# Patient Record
Sex: Female | Born: 1985 | Race: Black or African American | Hispanic: No | Marital: Single | Smoking: Never smoker
Health system: Southern US, Community
[De-identification: ages and names within clinical notes are randomized; demographics above are authoritative.]

## PROBLEM LIST (undated history)

## (undated) DIAGNOSIS — J45909 Unspecified asthma, uncomplicated: Secondary | ICD-10-CM

## (undated) DIAGNOSIS — O139 Gestational [pregnancy-induced] hypertension without significant proteinuria, unspecified trimester: Secondary | ICD-10-CM

## (undated) DIAGNOSIS — G43909 Migraine, unspecified, not intractable, without status migrainosus: Secondary | ICD-10-CM

## (undated) HISTORY — PX: ADENOIDECTOMY: SUR15

## (undated) HISTORY — PX: TONSILLECTOMY: SUR1361

---

## 2010-06-24 ENCOUNTER — Emergency Department: Payer: Self-pay | Admitting: Unknown Physician Specialty

## 2011-02-13 ENCOUNTER — Observation Stay: Payer: Self-pay | Admitting: Obstetrics and Gynecology

## 2011-03-21 ENCOUNTER — Encounter: Payer: Self-pay | Admitting: Maternal & Fetal Medicine

## 2011-03-22 ENCOUNTER — Encounter: Payer: Self-pay | Admitting: Maternal & Fetal Medicine

## 2011-04-11 ENCOUNTER — Observation Stay: Payer: Self-pay | Admitting: Obstetrics and Gynecology

## 2011-04-27 ENCOUNTER — Observation Stay: Payer: Self-pay | Admitting: Obstetrics and Gynecology

## 2011-05-03 ENCOUNTER — Inpatient Hospital Stay: Payer: Self-pay | Admitting: Obstetrics and Gynecology

## 2011-05-03 LAB — CBC WITH DIFFERENTIAL/PLATELET
Basophil %: 0.4 %
Eosinophil #: 0 10*3/uL (ref 0.0–0.7)
Eosinophil %: 0.2 %
HGB: 10.3 g/dL — ABNORMAL LOW (ref 12.0–16.0)
Lymphocyte #: 1.8 10*3/uL (ref 1.0–3.6)
Lymphocyte %: 32.4 %
MCH: 26.7 pg (ref 26.0–34.0)
MCHC: 33 g/dL (ref 32.0–36.0)
Monocyte #: 0.5 10*3/uL (ref 0.0–0.7)
Monocyte %: 9.2 %
Neutrophil %: 57.8 %
Platelet: 309 10*3/uL (ref 150–440)
RBC: 3.84 10*6/uL (ref 3.80–5.20)
RDW: 15.2 % — ABNORMAL HIGH (ref 11.5–14.5)
WBC: 5.6 10*3/uL (ref 3.6–11.0)

## 2011-05-05 LAB — HEMATOCRIT: HCT: 29 % — ABNORMAL LOW (ref 35.0–47.0)

## 2011-07-13 ENCOUNTER — Emergency Department: Payer: Self-pay | Admitting: Emergency Medicine

## 2011-08-17 ENCOUNTER — Emergency Department: Payer: Self-pay | Admitting: Emergency Medicine

## 2011-08-18 LAB — URINALYSIS, COMPLETE
Bacteria: NONE SEEN
Ph: 5 (ref 4.5–8.0)
Protein: 30
RBC,UR: 32 /HPF (ref 0–5)

## 2011-11-23 ENCOUNTER — Emergency Department: Payer: Self-pay | Admitting: Emergency Medicine

## 2011-11-23 LAB — URINALYSIS, COMPLETE
Bacteria: NONE SEEN
Bilirubin,UR: NEGATIVE
Glucose,UR: NEGATIVE mg/dL (ref 0–75)
Ph: 5 (ref 4.5–8.0)
Protein: 30
RBC,UR: 1 /HPF (ref 0–5)
Specific Gravity: 1.033 (ref 1.003–1.030)
Squamous Epithelial: 13
WBC UR: 5 /HPF (ref 0–5)

## 2011-11-23 LAB — WET PREP, GENITAL

## 2011-11-24 ENCOUNTER — Ambulatory Visit: Payer: Self-pay | Admitting: Obstetrics and Gynecology

## 2011-11-24 LAB — HEMOGLOBIN: HGB: 11.9 g/dL — ABNORMAL LOW (ref 12.0–16.0)

## 2011-11-28 LAB — PATHOLOGY REPORT

## 2014-07-08 NOTE — Op Note (Signed)
PATIENT NAME:  Stephanie RecordsGREENE, [name] MR#:  [id number] DATE OF BIRTH:  [birth year]  DATE OF PROCEDURE:  11/24/2011  PREOPERATIVE DIAGNOSES:  1. Incomplete abortion.  2. A+ blood type.   POSTOPERATIVE DIAGNOSES:  1. Inevitable abortion.  2. A+ blood type.   OPERATIVE PROCEDURE: Suction D and C.   SURGEON: Herold HarmsMartin A DeFrancesco, M.D.     FIRST ASSISTANT:  None.  ANESTHESIA: General LMA.   INDICATIONS: The patient is a 29 year old African American female, gravida 3, para 2-0-0-2, at 1110 weeks' gestation who presents for surgical management of nonviable pregnancy. The patient had two successive ultrasounds 24 hours apart which revealed an intrauterine pregnancy six weeks in size by crown-rump length without fetal cardiac activity. Quantitative hCG titers were rising inappropriately. The patient also had bleeding and cramping.   FINDINGS AT SURGERY: The cervical os was opened. There was POC extruding from the cervical os.   DESCRIPTION OF PROCEDURE: The patient was brought to the operating room where she was placed in the supine position. General anesthesia with LMA anesthesia was induced without difficulty. She was placed in the dorsal lithotomy position using candy-cane stirrups. A chlorhexadol perineal, intravaginal prep and drape was performed in the standard fashion. Red Robinson catheter was used to drain 25 mL of urine from the bladder. A weighted speculum was placed into the vagina and a single-tooth tenaculum was placed onto the anterior lip of the cervix. POC was identified coming from the cervical os and this was removed with ring forceps. Several passes were made with ring forceps with removal of pregnancy tissue. A #10 suction curette was then used with several passes to remove residual tissue from the endometrial cavity. A serrated curette was used to verify that no significant tissue was left behind. Upon completion of the procedure, all instrumentation was removed from the vagina. The  patient was then awakened, mobilized, and taken to the recovery room in satisfactory condition. All instrument, needle, and sponge counts were verified as correct. Estimated blood loss was minimal. Complications were none.     ____________________________ Prentice DockerMartin A. DeFrancesco, MD mad:bjt D: 11/24/2011 14:16:48 ET T: 11/24/2011 17:26:50 ET JOB#: [id number]  cc: Daphine DeutscherMartin A. DeFrancesco, MD, <Dictator> Prentice DockerMARTIN A DEFRANCESCO MD ELECTRONICALLY SIGNED 11/28/2011 16:40

## 2014-07-29 NOTE — H&P (Signed)
L&D Evaluation:  History:   HPI 29 yo AAF G2P1001 at 39.3 weeks    Presents with IOL    Patient's Medical History + Chlamydia rx'd 04/27/11    Patient's Surgical History none    Medications Pre [place]atal Vitamins    Allergies Iodine    Social History none    Family History Non-Contributory  FOB - Sickle Trait   ROS:   ROS All systems were reviewed.  HEENT, CNS, GI, GU, Respiratory, CV, Renal and Musculoskeletal systems were found to be normal.   Exam:   Vital Signs stable    Urine Protein negative dipstick    General no apparent distress    Mental Status clear    Heart normal sinus rhythm    Abdomen gravid, non-tender    Estimated Fetal Weight Average for gestational age, 7 #0    Back no CVAT    Edema no edema    Pelvic no external lesions, 3.5/70/-3/soft/mid/BOWI/VTX    FHT normal rate with no decels    Ucx irregular    Skin dry    Lymph no lymphadenopathy   Impression:   Impression 39.3 Intrauterine pregnancy; Recent Rx for Chlamydia 04/27/11   Plan:   Plan Cytotec IOL   Electronic Signatures: Josiane Labine, Prentice DockerMartin A (MD)  (Signed 13-Feb-13 04:04)  Authored: L&D Evaluation   Last Updated: 13-Feb-13 04:04 by Shashank Kwasnik, Prentice DockerMartin A (MD)

## 2014-07-29 NOTE — H&P (Signed)
L&D Evaluation:  History:   HPI 36 week Intrauterine pregnancy    Presents with contractions, + Chlamydia    Medications Pre [place]atal Vitamins   Exam:   Vital Signs stable    Estimated Fetal Weight Average for gestational age    Mebranes Intact    FHT NSt Reactive    Ucx irregular    Skin dry    Other +fFn   Impression:   Impression reactive NST, 36 week Intrauterine pregnancy; + Chlamydia   Plan:   Plan Zithromax 1 gram po    Comments Partner needs rx.    Follow Up Appointment already scheduled   Electronic Signatures: Stephanie Scott, Prentice DockerMartin A (MD)  (Signed 22-Jan-13 09:18)  Authored: L&D Evaluation   Last Updated: 22-Jan-13 09:18 by Anhar Mcdermott, Prentice DockerMartin A (MD)

## 2014-07-29 NOTE — H&P (Signed)
L&D Evaluation:  History:   HPI 38 week Intrauterine pregnancy    Presents with contractions    Patient's Medical History +Chlamydia; Dysplasia; History of ASB    Medications Pre Natal Vitamins    Allergies Iodine    Social History none    Family History Non-Contributory   ROS:   ROS All systems were reviewed.  HEENT, CNS, GI, GU, Respiratory, CV, Renal and Musculoskeletal systems were found to be normal.   Exam:   Vital Signs stable    General Uncomfortable    Mental Status clear    Heart normal sinus rhythm    Abdomen gravid, tender with contractions    Estimated Fetal Weight Average for gestational age    Back no CVAT    Pelvic no external lesions    Description 4/80/-3/vrtx/BOWI    FHT normal rate with no decels, Reactive NST    Skin dry    Lymph no lymphadenopathy   Impression:   Impression reactive NST, 38 week Intrauterine pregnancy; +Chlamydia, TOC + after therapyt   Plan:   Plan discharge    Comments Pt given morphine last pm with decrease in contractions and pain. IV Zithromax given for Chlamydia.    Follow Up Appointment 1 day   Electronic Signatures: Ford Peddie, Prentice DockerMartin A (MD)  (Signed 06-Feb-13 08:48)  Authored: L&D Evaluation   Last Updated: 06-Feb-13 08:48 by Daryn Pisani, Prentice DockerMartin A (MD)

## 2016-09-30 ENCOUNTER — Emergency Department (HOSPITAL_BASED_OUTPATIENT_CLINIC_OR_DEPARTMENT_OTHER)
Admission: EM | Admit: 2016-09-30 | Discharge: 2016-09-30 | Disposition: A | Discharge status: Home / Self Care | Payer: Medicaid Other | Attending: Physician Assistant | Admitting: Physician Assistant

## 2016-09-30 ENCOUNTER — Encounter (HOSPITAL_BASED_OUTPATIENT_CLINIC_OR_DEPARTMENT_OTHER): Payer: Self-pay | Admitting: *Deleted

## 2016-09-30 ENCOUNTER — Emergency Department (HOSPITAL_BASED_OUTPATIENT_CLINIC_OR_DEPARTMENT_OTHER): Payer: Medicaid Other

## 2016-09-30 DIAGNOSIS — Y939 Activity, unspecified: Secondary | ICD-10-CM | POA: Insufficient documentation

## 2016-09-30 DIAGNOSIS — Y999 Unspecified external cause status: Secondary | ICD-10-CM | POA: Insufficient documentation

## 2016-09-30 DIAGNOSIS — S6991XA Unspecified injury of right wrist, hand and finger(s), initial encounter: Secondary | ICD-10-CM | POA: Diagnosis present

## 2016-09-30 DIAGNOSIS — S60221A Contusion of right hand, initial encounter: Secondary | ICD-10-CM | POA: Diagnosis not present

## 2016-09-30 DIAGNOSIS — Y929 Unspecified place or not applicable: Secondary | ICD-10-CM | POA: Diagnosis not present

## 2016-09-30 DIAGNOSIS — W228XXA Striking against or struck by other objects, initial encounter: Secondary | ICD-10-CM | POA: Diagnosis not present

## 2016-09-30 MED ORDER — MELOXICAM 15 MG PO TABS: 15.0000 mg | ORAL_TABLET | Freq: Every day | ORAL | 0 refills | Status: DC

## 2016-09-30 NOTE — ED Triage Notes (Signed)
A book case fell on her right hand yesterday at work. Swelling.

## 2016-09-30 NOTE — Discharge Instructions (Signed)
Contact a health care provider if: °Your symptoms do not improve after several days of treatment. °You have increased redness, swelling, or pain in your hand or fingers. °You have difficulty moving the injured area. °Your swelling or pain is not relieved with medicines. °Get help right away if: °You have severe pain. °Your hand or fingers become numb. °Your hand or fingers turn pale, blue, or cold. °You cannot move your hand or wrist. °Your hand is warm to the touch. °

## 2016-09-30 NOTE — ED Provider Notes (Signed)
[facility] Provider [facility]   CSN: [id number] Arrival date & time: 09/30/16  [id number]  By signing my name below, I, Rosana Fretana Waskiewicz, attest that this documentation has been prepared under the direction and in the presence of Arthor CaptainAbigail Antoniette Peake, PA-C.  Electronically Signed: Rosana Fretana Waskiewicz, ED Scribe. 09/30/16. 7:42 PM.  History   Chief Complaint Chief Complaint  Patient presents with  . Hand Injury   The history is provided by the patient. No language interpreter was used.   HPI Comments: Stephanie Scott is a 31 y.o. female who presents to the Emergency Department complaining of sudden onset, throbbing right hand pain onset yesterday night . Pt states a book case fell on her hand and forearm. Pt has a hx of a metacarpal fracture. Pt reports associated swelling to the area and mild numbness in her right middle finger. No other complaints at this time.  History reviewed. No pertinent past medical history.  There are no active problems to display for this patient.   History reviewed. No pertinent surgical history.  OB History    No data available       Home Medications    Prior to Admission medications   Medication Sig Start Date End Date Taking? Authorizing Provider  Amphetamine-Dextroamphetamine (ADDERALL PO) Take by mouth.   Yes [provider]    Family History No family history on file.  Social History Social History  Substance Use Topics  . Smoking status: Never Smoker  . Smokeless tobacco: Never Used  . Alcohol use No     Allergies   Patient has no known allergies.   Review of Systems Review of Systems   Physical Exam Updated Vital Signs BP 131/74   Pulse 90   Temp 98.2 F (36.8 C) (Oral)   Resp 20   Ht 5\' 6"  (1.676 m)   Wt 180 lb (81.6 kg)   LMP 09/25/2016   SpO2 100%   BMI 29.05 kg/m   Physical Exam  Constitutional: She is oriented to person, place, and time. She appears well-developed and well-nourished.  HENT:  Head:  Normocephalic.  Eyes: EOM are normal.  Neck: Normal range of motion.  Pulmonary/Chest: Effort normal.  Abdominal: She exhibits no distension.  Musculoskeletal: She exhibits edema and tenderness.  Significant swelling to the dorsum of the hand localized over the 2nd, 3rd and 4th MCP. Decreased sensation in the 3rd digit. Weakness with extrension and pain with flexion of the 3rd MCP.   Neurological: She is alert and oriented to person, place, and time.  Skin:  No abrasions or lacerations present.   Psychiatric: She has a normal mood and affect.  Nursing note and vitals reviewed.    ED Treatments / Results  DIAGNOSTIC STUDIES: Oxygen Saturation is 100% on RA, normal by my interpretation.   COORDINATION OF CARE: 7:16 PM-Discussed next steps with pt including pain management at home and immobilization with a splint. Pt verbalized understanding and is agreeable with the plan.   Labs (all labs ordered are listed, but only abnormal results are displayed) Labs Reviewed - No data to display  EKG  EKG Interpretation None       Radiology Dg Hand Complete Right  Result Date: 09/30/2016 CLINICAL DATA:  Left trauma. PICC shelf fell on hand yesterday. Pain over the dorsum of the hand, particularly at the second and third metacarpals. EXAM: RIGHT HAND - COMPLETE 3+ VIEW COMPARISON:  None. FINDINGS: Soft tissue swelling is present over the dorsal aspect of the MCP  joints. There is no underlying fracture. A linear hyperdensity is present within the ventral and ulnar aspect of the thumb. No other radiopaque foreign body is present. IMPRESSION: 1. Soft tissue swelling over the dorsal aspect of the MCP joints without underlying fracture. 2. Linear density within the soft tissues along the length ventral aspect of the thumb. This may represent foreign body. Remote calcification is also considered. Please correlate with physical exam for acute tenderness. Electronically Signed   By: Marin Roberts M.D.   On: 09/30/2016 18:57    Procedures Procedures (including critical care time) SPLINT APPLICATION Date/Time: 7:48 PM Authorized by: Arthor Captain Consent: Verbal consent obtained. Risks and benefits: risks, benefits and alternatives were discussed Consent given by: patient Splint applied by: orthopedic technician Location details: Right hand Splint type: finger splint Post-procedure: The splinted body part was neurovascularly unchanged following the procedure. Patient tolerance: Patient tolerated the procedure well with no immediate complications.    Medications Ordered in ED Medications - No data to display   Initial Impression / Assessment and Plan / ED Course  I have reviewed the triage vital signs and the nursing notes.  Pertinent labs & imaging results that were available during my care of the patient were reviewed by me and considered in my medical decision making (see chart for details).      Patient X-Ray negative for obvious fracture or dislocation. Allow time for swelling to resolve and if sxs continue, f/u with ortho hand. No evidence of tendon rupture.  Pt advised to follow up with orthopedics. Patient given splint while in ED, conservative therapy recommended and discussed. Patient will be discharged home & is agreeable with above plan. Returns precautions discussed. Pt appears safe for discharge.  Final Clinical Impressions(s) / ED Diagnoses   Final diagnoses:  Contusion of right hand, initial encounter    New Prescriptions New Prescriptions   No medications on file   I personally performed the services described in this documentation, which was scribed in my presence. The recorded information has been reviewed and is accurate.      Arthor Captain, PA-C 09/30/16 1950    Abelino Derrick, MD 09/30/16 2228

## 2018-02-08 ENCOUNTER — Encounter (HOSPITAL_COMMUNITY): Payer: Self-pay

## 2018-02-08 ENCOUNTER — Emergency Department (HOSPITAL_COMMUNITY)
Admission: EM | Admit: 2018-02-08 | Discharge: 2018-02-08 | Disposition: A | Discharge status: Home / Self Care | Payer: Medicaid Other | Attending: Emergency Medicine | Admitting: Emergency Medicine

## 2018-02-08 ENCOUNTER — Emergency Department (HOSPITAL_COMMUNITY): Payer: Medicaid Other

## 2018-02-08 DIAGNOSIS — Z79899 Other long term (current) drug therapy: Secondary | ICD-10-CM | POA: Diagnosis not present

## 2018-02-08 DIAGNOSIS — J45909 Unspecified asthma, uncomplicated: Secondary | ICD-10-CM | POA: Diagnosis not present

## 2018-02-08 DIAGNOSIS — R0602 Shortness of breath: Secondary | ICD-10-CM

## 2018-02-08 DIAGNOSIS — Z3201 Encounter for pregnancy test, result positive: Secondary | ICD-10-CM | POA: Diagnosis not present

## 2018-02-08 HISTORY — DX: Unspecified asthma, uncomplicated: J45.909

## 2018-02-08 HISTORY — DX: Migraine, unspecified, not intractable, without status migrainosus: G43.909

## 2018-02-08 LAB — CBC
HCT: 38.2 % (ref 36.0–46.0)
Hemoglobin: 11.6 g/dL — ABNORMAL LOW (ref 12.0–15.0)
MCH: 25.7 pg — ABNORMAL LOW (ref 26.0–34.0)
MCHC: 30.4 g/dL (ref 30.0–36.0)
MCV: 84.7 fL (ref 80.0–100.0)
PLATELETS: 377 10*3/uL (ref 150–400)
RBC: 4.51 MIL/uL (ref 3.87–5.11)
RDW: 13.9 % (ref 11.5–15.5)
WBC: 7.1 10*3/uL (ref 4.0–10.5)
nRBC: 0 % (ref 0.0–0.2)

## 2018-02-08 LAB — BASIC METABOLIC PANEL
ANION GAP: 8 (ref 5–15)
BUN: 9 mg/dL (ref 6–20)
CALCIUM: 9.2 mg/dL (ref 8.9–10.3)
CO2: 22 mmol/L (ref 22–32)
Chloride: 106 mmol/L (ref 98–111)
Creatinine, Ser: 0.78 mg/dL (ref 0.44–1.00)
GFR calc Af Amer: >60 mL/min (ref >60)
Glucose, Bld: 95 mg/dL (ref 70–99)
POTASSIUM: 3.7 mmol/L (ref 3.5–5.1)
Sodium: 136 mmol/L (ref 135–145)

## 2018-02-08 LAB — I-STAT TROPONIN, ED: TROPONIN I, POC: 0 ng/mL (ref 0.00–0.08)

## 2018-02-08 LAB — I-STAT BETA HCG BLOOD, ED (MC, WL, AP ONLY): I-stat hCG, quantitative: >2000 m[IU]/mL — ABNORMAL HIGH (ref <5)

## 2018-02-08 LAB — HCG, QUANTITATIVE, PREGNANCY: hCG, Beta Chain, Quant, S: 6097 m[IU]/mL — ABNORMAL HIGH (ref <5)

## 2018-02-08 MED ORDER — ALBUTEROL SULFATE (2.5 MG/3ML) 0.083% IN NEBU
INHALATION_SOLUTION | RESPIRATORY_TRACT | Status: AC
  Administered 2018-02-08: 5 mg via RESPIRATORY_TRACT
  Filled 2018-02-08: qty 3

## 2018-02-08 MED ORDER — ALBUTEROL SULFATE (2.5 MG/3ML) 0.083% IN NEBU
5.0000 mg | INHALATION_SOLUTION | Freq: Once | RESPIRATORY_TRACT | Status: AC
  Administered 2018-02-08: 5 mg via RESPIRATORY_TRACT
  Filled 2018-02-08: qty 6

## 2018-02-08 NOTE — ED Provider Notes (Signed)
[facility] EMERGENCY DEPARTMENT Provider Note   CSN: [id number] Arrival date & time: 02/08/18  2040     History   Chief Complaint Chief Complaint  Patient presents with  . Shortness of Breath  . Asthma  . Chest Pain    HPI Stephanie Scott is a 32 y.o. female.  Patient is a 32 y.o. Female with PMHx of asthma presenting for intermittent chest pain and shortness of breath. Patient states that she is coming in because her mother was concerned that she looked more short of breath today. Patient states that her shortness of breath occurs when she is walking, chest pain is L sided in breast region without radiation. Patient states that she is using her inhaler regularly and it helps. Denies radiation of her pain, nausea/vomiting, abdominal pain, sweating, cough, congestion.  The history is provided by the patient. No language interpreter was used.    Past Medical History:  Diagnosis Date  . Asthma   . Migraine     There are no active problems to display for this patient.   History reviewed. No pertinent surgical history.   OB History   None      Home Medications    Prior to Admission medications   Medication Sig Start Date End Date Taking? Authorizing Provider  albuterol (PROVENTIL HFA;VENTOLIN HFA) 108 (90 Base) MCG/ACT inhaler Inhale 1-2 puffs into the lungs every 6 (six) hours as needed for wheezing or shortness of breath.   Yes [provider]  amphetamine-dextroamphetamine (ADDERALL) 10 MG tablet Take 30 mg by mouth daily with breakfast.   Yes [provider]  meloxicam (MOBIC) 15 MG tablet Take 1 tablet (15 mg total) by mouth daily. Take 1 daily with food. Patient not taking: Reported on 02/08/2018 09/30/16   Arthor Captain, PA-C    Family History History reviewed. No pertinent family history.  Social History Social History   Tobacco Use  . Smoking status: Never Smoker  . Smokeless tobacco: Never Used  Substance Use  Topics  . Alcohol use: No  . Drug use: No     Allergies   Iodides; Penicillins; and Shellfish allergy   Review of Systems Review of Systems  Constitutional: Positive for activity change. Negative for chills and fever.  HENT: Negative for ear pain and sore throat.   Eyes: Negative for pain and visual disturbance.  Respiratory: Positive for shortness of breath. Negative for cough.   Cardiovascular: Positive for chest pain. Negative for palpitations.  Gastrointestinal: Negative for abdominal distention, abdominal pain, constipation, diarrhea, nausea and vomiting.  Genitourinary: Negative for dysuria and hematuria.  Musculoskeletal: Negative for arthralgias and back pain.  Skin: Negative for color change and rash.  Neurological: Negative for seizures and syncope.  All other systems reviewed and are negative.    Physical Exam Updated Vital Signs BP 135/83   Pulse (!) 102   Temp 98.4 F (36.9 C) (Oral)   Resp 16   Ht 5\' 5"  (1.651 m)   Wt 99.8 kg   LMP 01/04/2018   SpO2 100%   BMI 36.61 kg/m   Physical Exam  Constitutional: She appears well-developed and well-nourished. No distress.  HENT:  Head: Normocephalic and atraumatic.  Eyes: Conjunctivae are normal.  Neck: Neck supple.  Cardiovascular: Normal rate and regular rhythm.  No murmur heard. Pulmonary/Chest: Effort normal and breath sounds normal. No respiratory distress. She has no wheezes.  Abdominal: Soft. There is no tenderness.  Musculoskeletal: She exhibits no edema.  Neurological:  She is alert.  Skin: Skin is warm and dry.  Psychiatric: She has a normal mood and affect.  Nursing note and vitals reviewed.  ED Treatments / Results  Labs (all labs ordered are listed, but only abnormal results are displayed) Labs Reviewed  CBC - Abnormal; Notable for the following components:      Result Value   Hemoglobin 11.6 (*)    MCH 25.7 (*)    All other components within normal limits  HCG, QUANTITATIVE, PREGNANCY  - Abnormal; Notable for the following components:   hCG, Beta Chain, Quant, S 6,097 (*)    All other components within normal limits  I-STAT BETA HCG BLOOD, ED ([facility], [facility], [facility] ONLY) - Abnormal; Notable for the following components:   I-stat hCG, quantitative >2,000.0 (*)    All other components within normal limits  BASIC METABOLIC PANEL  I-STAT TROPONIN, ED    EKG EKG Interpretation  Date/Time:  Thursday February 08 2018 20:53:28 EST Ventricular Rate:  95 PR Interval:    QRS Duration: 81 QT Interval:  341 QTC Calculation: 429 R Axis:   23 Text Interpretation:  Sinus rhythm Low voltage, precordial leads T wave abnormality Abnormal ekg Confirmed by Gerhard MunchLockwood, Robert [phone]([id number]) on 02/08/2018 9:14:34 PM   Radiology Dg Chest 2 View  Result Date: 02/08/2018 CLINICAL DATA:  Chest pain and dyspnea x3 days. EXAM: CHEST - 2 VIEW COMPARISON:  None. FINDINGS: The heart size and mediastinal contours are within normal limits. Both lungs are clear. The visualized skeletal structures are unremarkable. IMPRESSION: No active cardiopulmonary disease. Electronically Signed   By: Tollie Ethavid  Kwon M.D.   On: 02/08/2018 22:05    Procedures Procedures (including critical care time)  Medications Ordered in ED Medications  albuterol (PROVENTIL) (2.5 MG/3ML) 0.083% nebulizer solution 5 mg (5 mg Nebulization Given 02/08/18 2118)     Initial Impression / Assessment and Plan / ED Course  I have reviewed the triage vital signs and the nursing notes.  Pertinent labs & imaging results that were available during my care of the patient were reviewed by me and considered in my medical decision making (see chart for details).     Patient is a 32 y.o. female with PMHx of asthma presenting for shortness of breath and chest pain. Patient states that chest pain that is worsening when she walks but decreased with rest.  Patient subjectively short of breath but no wheezing on exam, SpO2 normal. Albuterol nebulizer was given,  patient felt a lot better afterwards. Unlikely to be PNA or PTX - chest XR unremarkable. EKG was reviewed by myself and my attending physician - normal sinus rhythm, no new ST segment elevation or depressions. Unlikely to be ACS, patient has normal troponin, no risk factors/no indication for second troponin. Labs show that patient is pregnant, findings were discussed with patient. [place]. No abdominal pain at this time.  BMP unremarkable. Patient is not tachycardiac or hypoxic therefore unlikely to be a PE at this time.  Patient safe for discharge, strict return precautions discussed.   Final Clinical Impressions(s) / ED Diagnoses   Final diagnoses:  Shortness of breath  Positive pregnancy test    ED Discharge Orders    None       Joaquin CourtsWendel, Jakeel Starliper K, MD 02/09/18 Pernell Dupre0008    Gerhard MunchLockwood, Robert, MD 02/09/18 [phone]

## 2018-02-08 NOTE — ED Notes (Signed)
Attempted two iv's at this time unsuccessful will ask phlebotomy to come stick pt

## 2018-02-08 NOTE — ED Triage Notes (Signed)
Pt states for the past 3 days has been having chest pain and sob. Pt has been using nebulizer treatments and rescue inhaler at home without relief. Pt has hx of asthma. Pt is axox4 at this time and is speaking in full sentences.

## 2018-07-11 IMAGING — DX DG HAND COMPLETE 3+V*R*
3 series · 3 of 3 positions shown · non-contrast
Comparison: None.

CLINICAL DATA: Left trauma. PICC shelf fell on hand yesterday. Pain
over the dorsum of the hand, particularly at the second and third
metacarpals.

EXAM:
RIGHT HAND - COMPLETE 3+ VIEW

[hand pa]
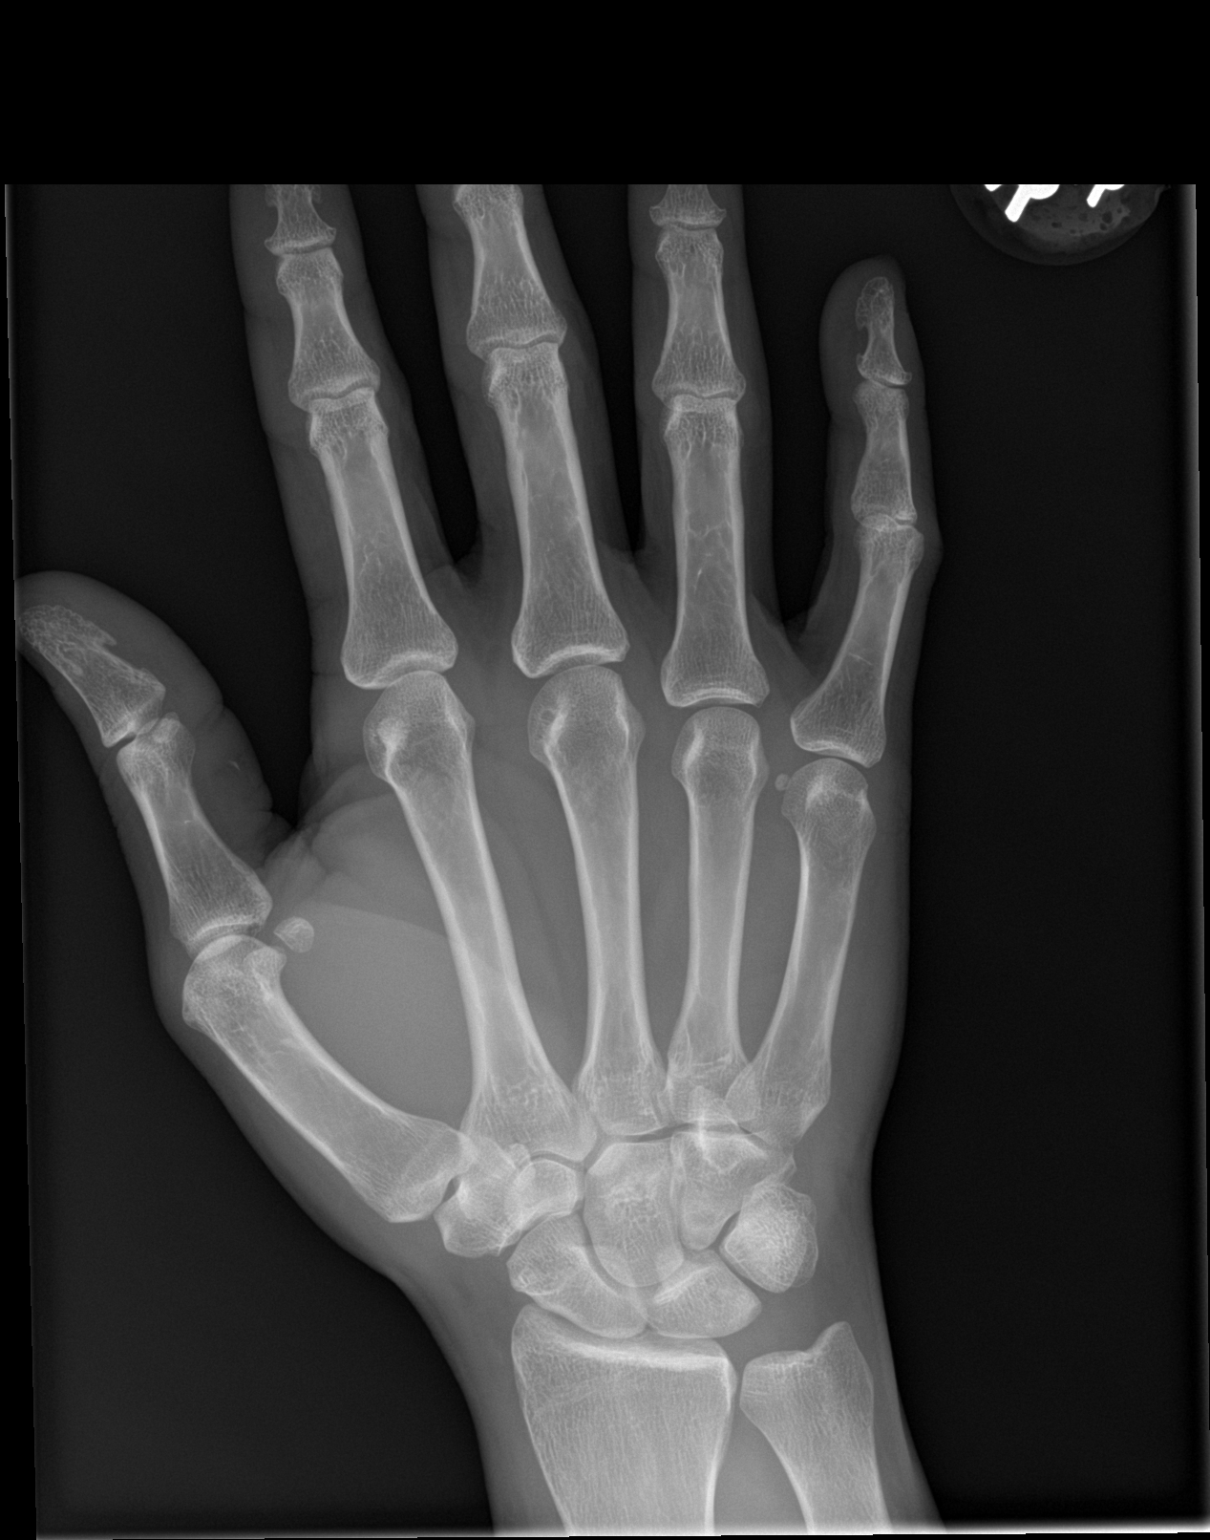

[hand obl]
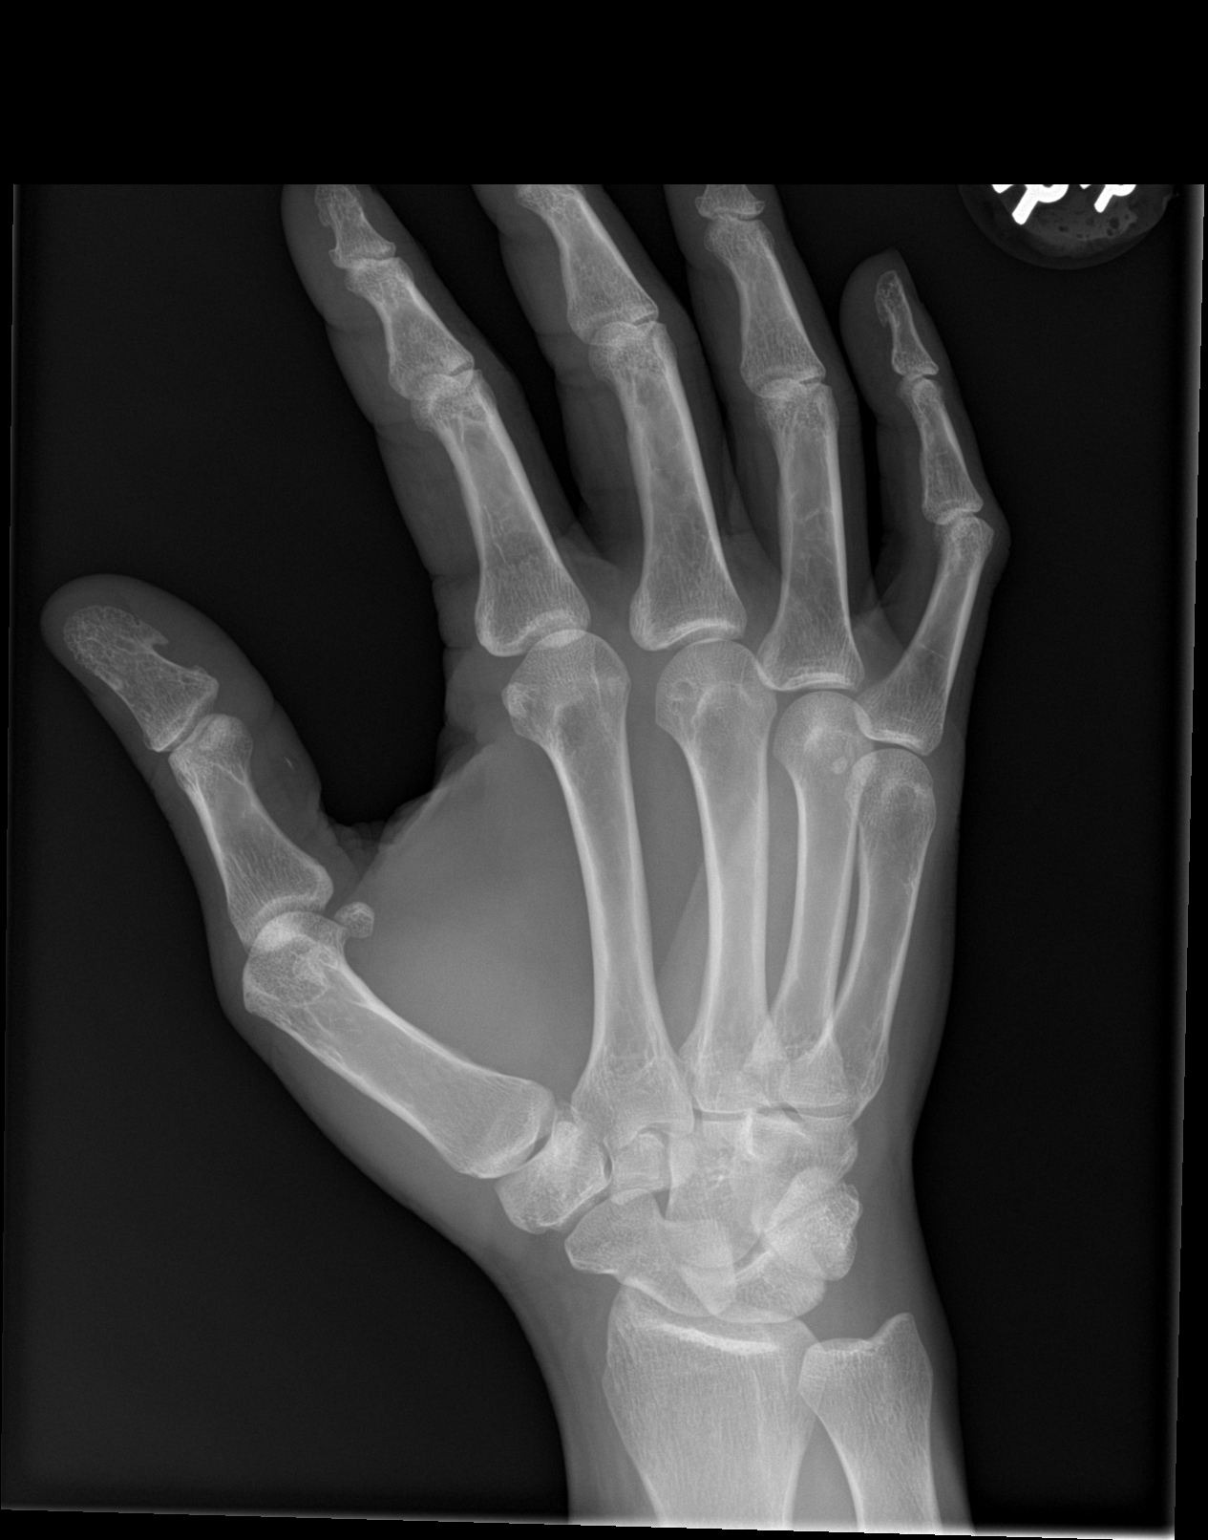

[hand lat]
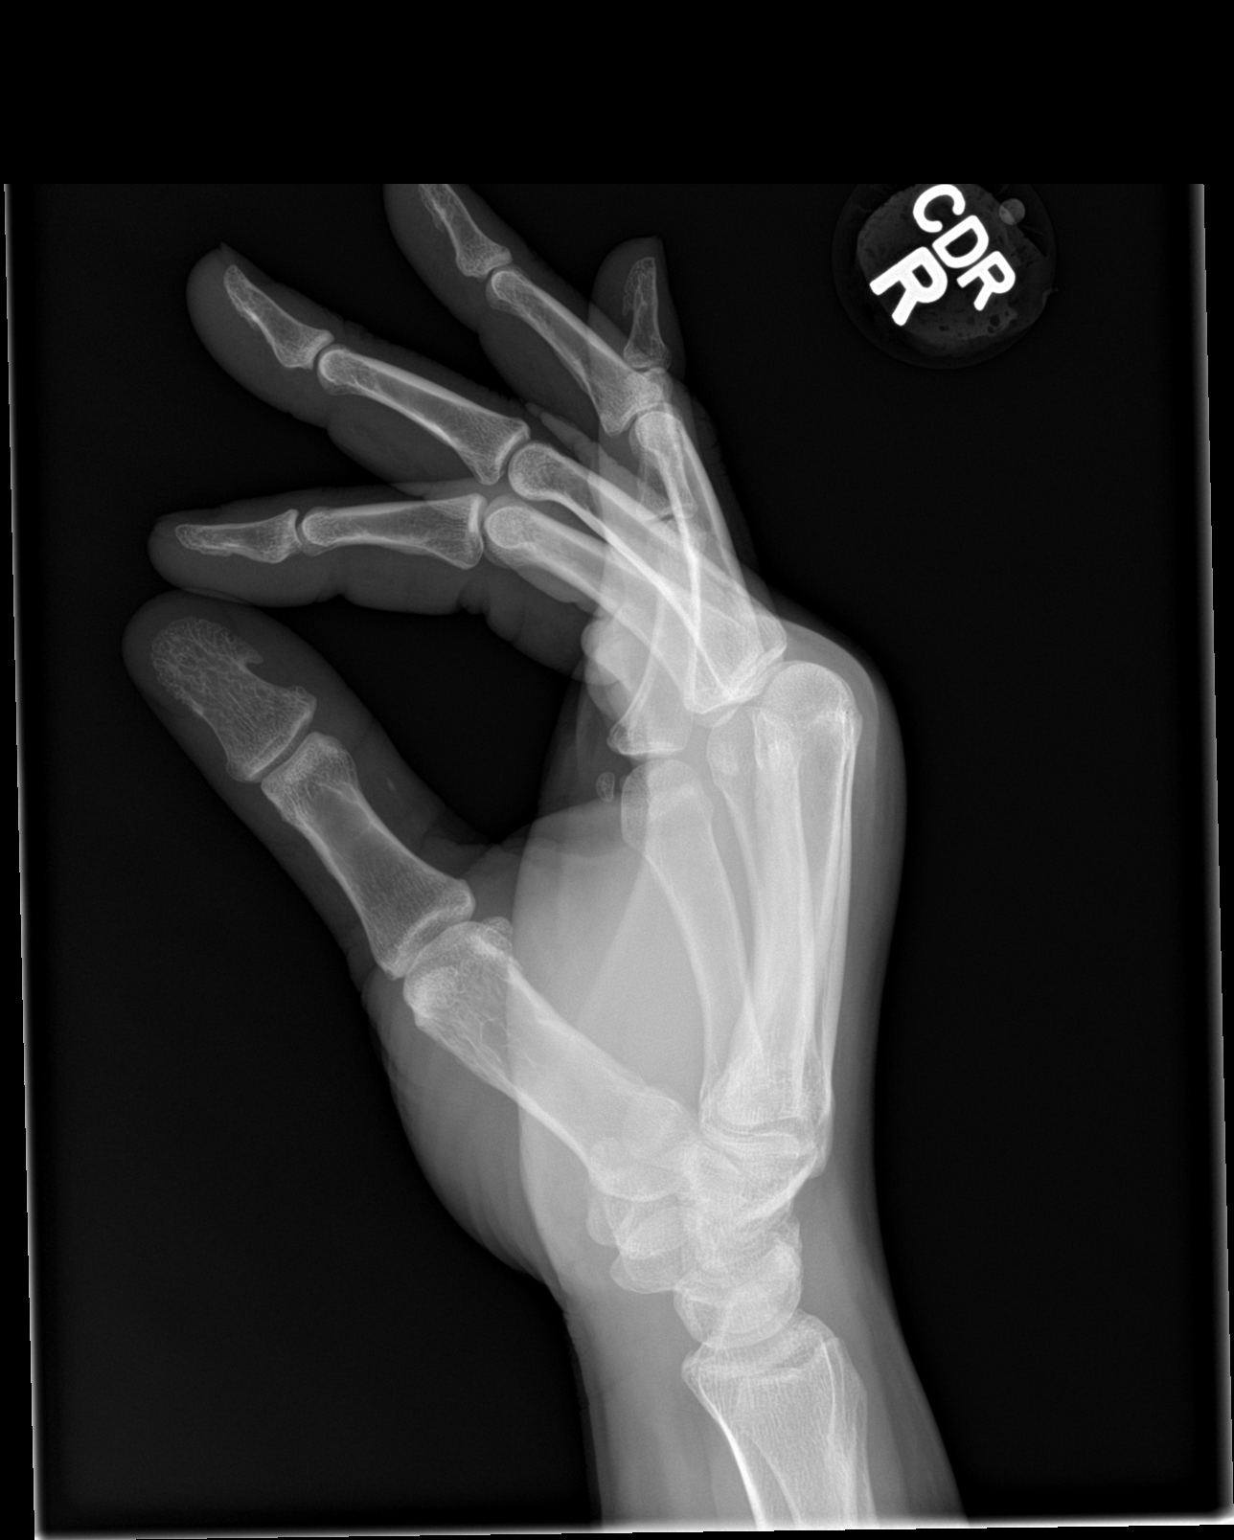

[3 of 3 positions shown; findings below may reference images not displayed]

FINDINGS: Soft tissue swelling is present over the dorsal aspect of the MCP
joints. There is no underlying fracture.

A linear hyperdensity is present within the ventral and ulnar aspect
of the thumb. No other radiopaque foreign body is present.
IMPRESSION: 1. Soft tissue swelling over the dorsal aspect of the MCP joints
without underlying fracture.
2. Linear density within the soft tissues along the length ventral
aspect of the thumb. This may represent foreign body. Remote
calcification is also considered. Please correlate with physical
exam for acute tenderness.

## 2018-08-03 ENCOUNTER — Other Ambulatory Visit: Payer: Self-pay

## 2018-08-03 ENCOUNTER — Inpatient Hospital Stay (HOSPITAL_COMMUNITY)
Admission: EM | Admit: 2018-08-03 | Discharge: 2018-08-04 | Disposition: A | Discharge status: Home / Self Care | Payer: Medicaid Other | Attending: Obstetrics and Gynecology | Admitting: Obstetrics and Gynecology

## 2018-08-03 DIAGNOSIS — O99353 Diseases of the nervous system complicating pregnancy, third trimester: Secondary | ICD-10-CM | POA: Insufficient documentation

## 2018-08-03 DIAGNOSIS — Z880 Allergy status to penicillin: Secondary | ICD-10-CM | POA: Insufficient documentation

## 2018-08-03 DIAGNOSIS — R109 Unspecified abdominal pain: Secondary | ICD-10-CM

## 2018-08-03 DIAGNOSIS — O99013 Anemia complicating pregnancy, third trimester: Secondary | ICD-10-CM | POA: Insufficient documentation

## 2018-08-03 DIAGNOSIS — O26899 Other specified pregnancy related conditions, unspecified trimester: Secondary | ICD-10-CM

## 2018-08-03 DIAGNOSIS — Z7982 Long term (current) use of aspirin: Secondary | ICD-10-CM | POA: Insufficient documentation

## 2018-08-03 DIAGNOSIS — Z3A30 30 weeks gestation of pregnancy: Secondary | ICD-10-CM | POA: Insufficient documentation

## 2018-08-03 DIAGNOSIS — M545 Low back pain: Secondary | ICD-10-CM | POA: Insufficient documentation

## 2018-08-03 DIAGNOSIS — R51 Headache: Secondary | ICD-10-CM | POA: Insufficient documentation

## 2018-08-03 DIAGNOSIS — Z3689 Encounter for other specified antenatal screening: Secondary | ICD-10-CM

## 2018-08-03 DIAGNOSIS — G8929 Other chronic pain: Secondary | ICD-10-CM

## 2018-08-03 DIAGNOSIS — R519 Headache, unspecified: Secondary | ICD-10-CM

## 2018-08-03 DIAGNOSIS — D649 Anemia, unspecified: Secondary | ICD-10-CM | POA: Insufficient documentation

## 2018-08-03 HISTORY — DX: Gestational (pregnancy-induced) hypertension without significant proteinuria, unspecified trimester: O13.9

## 2018-08-03 NOTE — MAU Note (Signed)
Pt stated she started havingca headache today and seeing some spots ( has be told sh has preeclampsia). Took some tylenil and then started having severe back pain and abd cramping. Gets care [place]

## 2018-08-04 ENCOUNTER — Encounter (HOSPITAL_COMMUNITY): Payer: Self-pay | Admitting: *Deleted

## 2018-08-04 DIAGNOSIS — M545 Low back pain: Secondary | ICD-10-CM

## 2018-08-04 DIAGNOSIS — O99353 Diseases of the nervous system complicating pregnancy, third trimester: Secondary | ICD-10-CM | POA: Diagnosis not present

## 2018-08-04 DIAGNOSIS — Z3A30 30 weeks gestation of pregnancy: Secondary | ICD-10-CM | POA: Diagnosis not present

## 2018-08-04 DIAGNOSIS — O99013 Anemia complicating pregnancy, third trimester: Secondary | ICD-10-CM | POA: Diagnosis not present

## 2018-08-04 DIAGNOSIS — Z7982 Long term (current) use of aspirin: Secondary | ICD-10-CM | POA: Diagnosis not present

## 2018-08-04 DIAGNOSIS — O26899 Other specified pregnancy related conditions, unspecified trimester: Secondary | ICD-10-CM | POA: Diagnosis not present

## 2018-08-04 DIAGNOSIS — R109 Unspecified abdominal pain: Secondary | ICD-10-CM

## 2018-08-04 DIAGNOSIS — G8929 Other chronic pain: Secondary | ICD-10-CM

## 2018-08-04 DIAGNOSIS — Z880 Allergy status to penicillin: Secondary | ICD-10-CM | POA: Diagnosis not present

## 2018-08-04 DIAGNOSIS — D649 Anemia, unspecified: Secondary | ICD-10-CM | POA: Diagnosis not present

## 2018-08-04 DIAGNOSIS — R51 Headache: Secondary | ICD-10-CM | POA: Diagnosis not present

## 2018-08-04 LAB — CBC
HCT: 30.4 % — ABNORMAL LOW (ref 36.0–46.0)
Hemoglobin: 9.9 g/dL — ABNORMAL LOW (ref 12.0–15.0)
MCH: 26.1 pg (ref 26.0–34.0)
MCHC: 32.6 g/dL (ref 30.0–36.0)
MCV: 80 fL (ref 80.0–100.0)
Platelets: 321 10*3/uL (ref 150–400)
RBC: 3.8 MIL/uL — ABNORMAL LOW (ref 3.87–5.11)
RDW: 14.1 % (ref 11.5–15.5)
WBC: 7 10*3/uL (ref 4.0–10.5)
nRBC: 0 % (ref 0.0–0.2)

## 2018-08-04 LAB — URINALYSIS, ROUTINE W REFLEX MICROSCOPIC
Bilirubin Urine: NEGATIVE
Glucose, UA: NEGATIVE mg/dL
Hgb urine dipstick: NEGATIVE
Ketones, ur: NEGATIVE mg/dL
Leukocytes,Ua: NEGATIVE
Nitrite: NEGATIVE
Protein, ur: 30 mg/dL — AB
Specific Gravity, Urine: 1.034 — ABNORMAL HIGH (ref 1.005–1.030)
pH: 5 (ref 5.0–8.0)

## 2018-08-04 LAB — WET PREP, GENITAL
Clue Cells Wet Prep HPF POC: NONE SEEN
Sperm: NONE SEEN
Trich, Wet Prep: NONE SEEN
Yeast Wet Prep HPF POC: NONE SEEN

## 2018-08-04 LAB — COMPREHENSIVE METABOLIC PANEL
ALT: 13 U/L (ref 0–44)
AST: 15 U/L (ref 15–41)
Albumin: 3.1 g/dL — ABNORMAL LOW (ref 3.5–5.0)
Alkaline Phosphatase: 66 U/L (ref 38–126)
Anion gap: 9 (ref 5–15)
BUN: 8 mg/dL (ref 6–20)
CO2: 19 mmol/L — ABNORMAL LOW (ref 22–32)
Calcium: 8.9 mg/dL (ref 8.9–10.3)
Chloride: 106 mmol/L (ref 98–111)
Creatinine, Ser: 0.56 mg/dL (ref 0.44–1.00)
GFR calc Af Amer: >60 mL/min (ref >60)
GFR calc non Af Amer: >60 mL/min (ref >60)
Glucose, Bld: 96 mg/dL (ref 70–99)
Potassium: 3.3 mmol/L — ABNORMAL LOW (ref 3.5–5.1)
Sodium: 134 mmol/L — ABNORMAL LOW (ref 135–145)
Total Bilirubin: 0.4 mg/dL (ref 0.3–1.2)
Total Protein: 6.2 g/dL — ABNORMAL LOW (ref 6.5–8.1)

## 2018-08-04 LAB — PROTEIN / CREATININE RATIO, URINE
Creatinine, Urine: 257.21 mg/dL
Protein Creatinine Ratio: 0.11 mg/mg{Cre} (ref 0.00–0.15)
Total Protein, Urine: 29 mg/dL

## 2018-08-04 LAB — FETAL FIBRONECTIN: Fetal Fibronectin: NEGATIVE

## 2018-08-04 MED ORDER — BUTALBITAL-APAP-CAFFEINE 50-325-40 MG PO TABS
1.0000 | ORAL_TABLET | Freq: Once | ORAL | Status: AC
  Administered 2018-08-04: 1 via ORAL
  Filled 2018-08-04: qty 1

## 2018-08-04 NOTE — Discharge Instructions (Signed)
Preterm Labor and Birth Information ° °The normal length of a pregnancy is 39-41 weeks. Preterm labor is when labor starts before 37 completed weeks of pregnancy. °What are the risk factors for preterm labor? °Preterm labor is more likely to occur in women who: °· Have certain infections during pregnancy such as a bladder infection, sexually transmitted infection, or infection inside the uterus (chorioamnionitis). °· Have a shorter-than-normal cervix. °· Have gone into preterm labor before. °· Have had surgery on their cervix. °· Are younger than age 17 or older than age 35. °· Are African American. °· Are pregnant with twins or multiple babies (multiple gestation). °· Take street drugs or smoke while pregnant. °· Do not gain enough weight while pregnant. °· Became pregnant shortly after having been pregnant. °What are the symptoms of preterm labor? °Symptoms of preterm labor include: °· Cramps similar to those that can happen during a menstrual period. The cramps may happen with diarrhea. °· Pain in the abdomen or lower back. °· Regular uterine contractions that may feel like tightening of the abdomen. °· A feeling of increased pressure in the pelvis. °· Increased watery or bloody mucus discharge from the vagina. °· Water breaking (ruptured amniotic sac). °Why is it important to recognize signs of preterm labor? °It is important to recognize signs of preterm labor because babies who are born prematurely may not be fully developed. This can put them at an increased risk for: °· Long-term (chronic) heart and lung problems. °· Difficulty immediately after birth with regulating body systems, including blood sugar, body temperature, heart rate, and breathing rate. °· Bleeding in the brain. °· Cerebral palsy. °· Learning difficulties. °· Death. °These risks are highest for babies who are born before 34 weeks of pregnancy. °How is preterm labor treated? °Treatment depends on the length of your pregnancy, your condition,  and the health of your baby. It may involve: °· Having a stitch (suture) placed in your cervix to prevent your cervix from opening too early (cerclage). °· Taking or being given medicines, such as: °? Hormone medicines. These may be given early in pregnancy to help support the pregnancy. °? Medicine to stop contractions. °? Medicines to help mature the baby’s lungs. These may be prescribed if the risk of delivery is high. °? Medicines to prevent your baby from developing cerebral palsy. °If the labor happens before 34 weeks of pregnancy, you may need to stay in the hospital. °What should I do if I think I am in preterm labor? °If you think that you are going into preterm labor, call your health care provider right away. °How can I prevent preterm labor in future pregnancies? °To increase your chance of having a full-term pregnancy: °· Do not use any tobacco products, such as cigarettes, chewing tobacco, and e-cigarettes. If you need help quitting, ask your health care provider. °· Do not use street drugs or medicines that have not been prescribed to you during your pregnancy. °· Talk with your health care provider before taking any herbal supplements, even if you have been taking them regularly. °· Make sure you gain a healthy amount of weight during your pregnancy. °· Watch for infection. If you think that you might have an infection, get it checked right away. °· Make sure to tell your health care provider if you have gone into preterm labor before. °This information is not intended to replace advice given to you by your health care provider. Make sure you discuss any questions you have with your   health care provider. Document Released: 05/28/2003 Document Revised: 08/18/2015 Document Reviewed: 07/29/2015 Else[employer] Interactive Patient Education  2019 [employer] Preeclampsia and Eclampsia  Preeclampsia is a serious condition that may develop during pregnancy. It is also called toxemia of pregnancy. This  condition causes high blood pressure along with other symptoms, such as swelling and headaches. These symptoms may develop as the condition gets worse. Preeclampsia may occur at 20 weeks of pregnancy or later. Diagnosing and treating preeclampsia early is very important. If not treated early, it can cause serious problems for you and your baby. One problem it can lead to is eclampsia. Eclampsia is a condition that causes muscle jerking or shaking (convulsions or seizures) and other serious problems for the mother. During pregnancy, delivering your baby may be the best treatment for preeclampsia or eclampsia. For most women, preeclampsia and eclampsia symptoms go away after giving birth. In rare cases, a woman may develop preeclampsia after giving birth (postpartum preeclampsia). This usually occurs within 48 hours after childbirth but may occur up to 6 weeks after giving birth. What are the causes? The cause of preeclampsia is not known. What increases the risk? The following risk factors make you more likely to develop preeclampsia:  Being pregnant for the first time.  Having had preeclampsia during a past pregnancy.  Having a family history of preeclampsia.  Having high blood pressure.  Being pregnant with more than one baby.  Being 2535 or older.  Being African-American.  Having kidney disease or diabetes.  Having medical conditions such as lupus or blood diseases.  Being very overweight (obese). What are the signs or symptoms? The earliest signs of preeclampsia are:  High blood pressure.  Increased protein in your urine. Your health care provider will check for this at every visit before you give birth (prenatal visit). Other symptoms that may develop as the condition gets worse include:  Severe headaches.  Sudden weight gain.  Swelling of the hands, face, legs, and feet.  Nausea and vomiting.  Vision problems, such as blurred or double vision.  Numbness in the face,  arms, legs, and feet.  Urinating less than usual.  Dizziness.  Slurred speech.  Abdominal pain, especially upper abdominal pain.  Convulsions or seizures. How is this diagnosed? There are no screening tests for preeclampsia. Your health care provider will ask you about symptoms and check for signs of preeclampsia during your prenatal visits. You may also have tests that include:  Urine tests.  Blood tests.  Checking your blood pressure.  Monitoring your babys heart rate.  Ultrasound. How is this treated? You and your health care provider will determine the treatment approach that is best for you. Treatment may include:  Having more frequent prenatal exams to check for signs of preeclampsia, if you have an increased risk for preeclampsia.  Medicine to lower your blood pressure.  Staying in the hospital, if your condition is severe. There, treatment will focus on controlling your blood pressure and the amount of fluids in your body (fluid retention).  Taking medicine (magnesium sulfate) to prevent seizures. This may be given as an injection or through an IV.  Taking a low-dose aspirin during your pregnancy.  Delivering your baby early, if your condition gets worse. You may have your labor started with medicine (induced), or you may have a cesarean delivery. Follow these instructions at home: Eating and drinking   Drink enough fluid to keep your urine pale yellow.  Avoid caffeine. Lifestyle  Do not use any products  that contain nicotine or tobacco, such as cigarettes and e-cigarettes. If you need help quitting, ask your health care provider.  Do not use alcohol or drugs.  Avoid stress as much as possible. Rest and get plenty of sleep. General instructions  Take over-the-counter and prescription medicines only as told by your health care provider.  When lying down, lie on your left side. This keeps pressure off your major blood vessels.  When sitting or lying  down, raise (elevate) your feet. Try putting some pillows underneath your lower legs.  Exercise regularly. Ask your health care provider what kinds of exercise are best for you.  Keep all follow-up and prenatal visits as told by your health care provider. This is important. How is this prevented? There is no known way of preventing preeclampsia or eclampsia from developing. However, to lower your risk of complications and detect problems early:  Get regular prenatal care. Your health care provider may be able to diagnose and treat the condition early.  Maintain a healthy weight. Ask your health care provider for help managing weight gain during pregnancy.  Work with your health care provider to manage any long-term (chronic) health conditions you have, such as diabetes or kidney problems.  You may have tests of your blood pressure and kidney function after giving birth.  Your health care provider may have you take low-dose aspirin during your next pregnancy. Contact a health care provider if:  You have symptoms that your health care provider told you may require more treatment or monitoring, such as: ? Headaches. ? Nausea or vomiting. ? Abdominal pain. ? Dizziness. ? Light-headedness. Get help right away if:  You have severe: ? Abdominal pain. ? Headaches that do not get better. ? Dizziness. ? Vision problems. ? Confusion. ? Nausea or vomiting.  You have any of the following: ? A seizure. ? Sudden, rapid weight gain. ? Sudden swelling in your hands, ankles, or face. ? Trouble moving any part of your body. ? Numbness in any part of your body. ? Trouble speaking. ? Abnormal bleeding.  You faint. Summary  Preeclampsia is a serious condition that may develop during pregnancy. It is also called toxemia of pregnancy.  This condition causes high blood pressure along with other symptoms, such as swelling and headaches.  Diagnosing and treating preeclampsia early is very  important. If not treated early, it can cause serious problems for you and your baby.  Get help right away if you have symptoms that your health care provider told you to watch for. This information is not intended to replace advice given to you by your health care provider. Make sure you discuss any questions you have with your health care provider. Document Released: 03/04/2000 Document Revised: 02/21/2017 Document Reviewed: 10/12/2015 Else[employer] Interactive Patient Education  2019 [employer] Back Pain in Pregnancy Back pain during pregnancy is common. Back pain may be caused by several factors that are related to changes during your pregnancy. Follow these instructions at home: Managing pain, stiffness, and swelling      If directed, for sudden (acute) back pain, put ice on the painful area. ? Put ice in a plastic bag. ? Place a towel between your skin and the bag. ? Leave the ice on for 20 minutes, 2-3 times per day.  If directed, apply heat to the affected area before you exercise. Use the heat source that your health care provider recommends, such as a moist heat pack or a heating pad. ? Place a towel  between your skin and the heat source. ? Leave the heat on for 20-30 minutes. ? Remove the heat if your skin turns bright red. This is especially important if you are unable to feel pain, heat, or cold. You may have a greater risk of getting burned.  If directed, massage the affected area. Activity  Exercise as told by your health care provider. Gentle exercise is the best way to prevent or manage back pain.  Listen to your body when lifting. If lifting hurts, ask for help or bend your knees. This uses your leg muscles instead of your back muscles.  Squat down when picking up something from the floor. Do not bend over.  Only use bed rest for short periods as told by your health care provider. Bed rest should only be used for the most severe episodes of back pain. Standing,  sitting, and lying down  Do not stand in one place for long periods of time.  Use good posture when sitting. Make sure your head rests over your shoulders and is not hanging forward. Use a pillow on your lower back if necessary.  Try sleeping on your side, preferably the left side, with a pregnancy support pillow or 1-2 regular pillows between your legs. ? If you have back pain after a night's rest, your bed may be too soft. ? A firm mattress may provide more support for your back during pregnancy. General instructions  Do not wear high heels.  Eat a healthy diet. Try to gain weight within your health care provider's recommendations.  Use a maternity girdle, elastic sling, or back brace as told by your health care provider.  Take over-the-counter and prescription medicines only as told by your health care provider.  Work with a physical therapist or massage therapist to find ways to manage back pain. Acupuncture or massage therapy may be helpful.  Keep all follow-up visits as told by your health care provider. This is important. Contact a health care provider if:  Your back pain interferes with your daily activities.  You have increasing pain in other parts of your body. Get help right away if:  You develop numbness, tingling, weakness, or problems with the use of your arms or legs.  You develop severe back pain that is not controlled with medicine.  You have a change in bowel or bladder control.  You develop shortness of breath, dizziness, or you faint.  You develop nausea, vomiting, or sweating.  You have back pain that is a rhythmic, cramping pain similar to labor pains. Labor pain is usually 1-2 minutes apart, lasts for about 1 minute, and involves a bearing down feeling or pressure in your pelvis.  You have back pain and your water breaks or you have vaginal bleeding.  You have back pain or numbness that travels down your leg.  Your back pain developed after you  fell.  You develop pain on one side of your back.  You see blood in your urine.  You develop skin blisters in the area of your back pain. Summary  Back pain may be caused by several factors that are related to changes during your pregnancy.  Follow instructions as told by your health care provider for managing pain, stiffness, and swelling.  Exercise as told by your health care provider. Gentle exercise is the best way to prevent or manage back pain.  Take over-the-counter and prescription medicines only as told by your health care provider.  Keep all follow-up visits as told  by your health care provider. This is important. This information is not intended to replace advice given to you by your health care provider. Make sure you discuss any questions you have with your health care provider. Document Released: 06/15/2005 Document Revised: 08/23/2017 Document Reviewed: 08/23/2017 [employer] Interactive Patient Education  2019 [employer].

## 2018-08-04 NOTE — MAU Provider Note (Signed)
History     CSN: [id number]  Arrival date and time: 08/03/18 2321   First Provider Initiated Contact with Patient 08/04/18 0046      Chief Complaint  Patient presents with  . Back Pain  . Abdominal Pain  . Headache   Stephanie Scott is a 33 y.o. [id number] at [redacted]w[redacted]d who presents to MAU for LBP and HA.  Pt also reports she was diagnosed with preeclampsia by her OB about 3weeks ago and has a hx of preeclampsia with previous pregnancies. Pt reports she was told she would be delivered at 35weeks because of the condition.  Pt reports current HA rated as 8/10. Pt took Tylenol around 2000 and took  and reports it was a 10/10 at that time. Pt reports blurry vision at this time with and without glasses. Pt reports she has also tried taking Flexeril in the past for her HA, but that has not worked for her preeclampsia HA either. Pt reports a hx of migraines.  Pt denies seeing spots, N/V, epigastric pain, swelling in face and hands, sudden weight gain. Pt denies chest pain and SOB.  Pt denies constipation, diarrhea, or urinary problems. Pt denies fever, chills, fatigue, sweating or changes in appetite. Pt denies dizziness, light-headedness, weakness.  Pt denies VB, ctx, LOF and reports good FM.  Current pregnancy problems? preeclampsia Allergies? See chart Current medications? PNVs, baby ASA Current PNC & next appt? [facility] in [place], 08/14/2018  Back Pain  This is a chronic problem. The current episode started more than 1 month ago (pt reports yesterday morning around 0900 the quality of her pain changed to include a cramping sensation, which is why she is seeking care in MAU today). The problem occurs constantly. The problem has been gradually worsening since onset. The pain is present in the lumbar spine. The quality of the pain is described as cramping and aching. Radiates to: lower abdomen/pelvis. The pain is at a severity of 8/10. The pain is severe. The pain  is the same all the time. The symptoms are aggravated by bending, position, standing and twisting. Associated symptoms include headaches and pelvic pain. Pertinent negatives include no abdominal pain, chest pain, dysuria, fever or weakness. She has tried heat and ice (Tylenol and flexeril) for the symptoms. The treatment provided moderate relief.    OB History    Gravida  5   Para  3   Term      Preterm  3   AB  1   Living  3     SAB  1   TAB      Ectopic      Multiple      Live Births  3           Past Medical History:  Diagnosis Date  . Asthma   . Migraine   . Pregnancy induced hypertension     Past Surgical History:  Procedure Laterality Date  . ADENOIDECTOMY    . TONSILLECTOMY      No family history on file.  Social History   Tobacco Use  . Smoking status: Never Smoker  . Smokeless tobacco: Never Used  Substance Use Topics  . Alcohol use: No  . Drug use: No    Allergies:  Allergies  Allergen Reactions  . Iodides Itching and Swelling  . Penicillins Hives  . Shellfish Allergy Swelling  . Hydrocortisone Rash    Medications Prior to Admission  Medication Sig Dispense Refill Last Dose  .  albuterol (PROVENTIL HFA;VENTOLIN HFA) 108 (90 Base) MCG/ACT inhaler Inhale 1-2 puffs into the lungs every 6 (six) hours as needed for wheezing or shortness of breath.   Past Month at Unknown time  . amphetamine-dextroamphetamine (ADDERALL) 10 MG tablet Take 30 mg by mouth daily with breakfast.   02/07/2018 at Unknown time  . meloxicam (MOBIC) 15 MG tablet Take 1 tablet (15 mg total) by mouth daily. Take 1 daily with food. (Patient not taking: Reported on 02/08/2018) 10 tablet 0 Not Taking at Unknown time    Review of Systems  Constitutional: Negative for chills, diaphoresis, fatigue and fever.  Respiratory: Negative for shortness of breath.   Cardiovascular: Negative for chest pain.  Gastrointestinal: Negative for abdominal pain, constipation, diarrhea,  nausea and vomiting.  Genitourinary: Positive for pelvic pain. Negative for dysuria, flank pain, frequency, vaginal bleeding and vaginal discharge.  Neurological: Positive for headaches. Negative for dizziness, weakness and light-headedness.   Physical Exam   Blood pressure 114/76, pulse (!) 117, temperature 98.6 F (37 C), temperature source Oral, resp. rate 17, height 5\' 6"  (1.676 m), last menstrual period 01/04/2018.  Patient Vitals for the past 24 hrs:  BP Temp Temp src Pulse Resp Height  08/04/18 0343 114/76 98.6 F (37 C) Oral (!) 117 17 -  08/04/18 0116 120/71 - - (!) 105 - -  08/04/18 0101 133/78 - - (!) 105 - -  08/04/18 0046 128/72 - - 98 - -  08/04/18 0031 133/78 - - (!) 103 - -  08/04/18 0016 128/80 - - (!) 101 - -  08/04/18 0006 140/80 98.3 F (36.8 C) Oral (!) 112 18 5\' 6"  (1.676 m)    Physical Exam  Constitutional: She is oriented to person, place, and time. She appears well-developed and well-nourished. No distress.  HENT:  Head: Normocephalic and atraumatic.  Respiratory: Effort normal.  GI: Soft. She exhibits no distension and no mass. There is no abdominal tenderness. There is no rebound and no guarding.  Genitourinary: There is no rash, tenderness or lesion on the right labia. There is no rash, tenderness or lesion on the left labia. Uterus is not fixed and not tender. Cervix exhibits no motion tenderness, no discharge and no friability.    No vaginal discharge, tenderness or bleeding.  No tenderness or bleeding in the vagina.  Musculoskeletal:     Comments: +tenderness on palpation of lumbar region of back  Neurological: She is alert and oriented to person, place, and time.  Skin: Skin is warm and dry. She is not diaphoretic.  Psychiatric: She has a normal mood and affect. Her behavior is normal. Judgment and thought content normal.   Results for orders placed or performed during the hospital encounter of 08/03/18 (from the past 24 hour(s))  Urinalysis,  Routine w reflex microscopic     Status: Abnormal   Collection Time: 08/04/18 12:20 AM  Result Value Ref Range   Color, Urine YELLOW YELLOW   APPearance HAZY (A) CLEAR   Specific Gravity, Urine 1.034 (H) 1.005 - 1.030   pH 5.0 5.0 - 8.0   Glucose, UA NEGATIVE NEGATIVE mg/dL   Hgb urine dipstick NEGATIVE NEGATIVE   Bilirubin Urine NEGATIVE NEGATIVE   Ketones, ur NEGATIVE NEGATIVE mg/dL   Protein, ur 30 (A) NEGATIVE mg/dL   Nitrite NEGATIVE NEGATIVE   Leukocytes,Ua NEGATIVE NEGATIVE   RBC / HPF 0-5 0 - 5 RBC/hpf   WBC, UA 0-5 0 - 5 WBC/hpf   Bacteria, UA RARE (A) NONE SEEN  Squamous Epithelial / LPF 11-20 0 - 5   Mucus PRESENT   Fetal fibronectin     Status: None   Collection Time: 08/04/18  1:10 AM  Result Value Ref Range   Fetal Fibronectin NEGATIVE NEGATIVE  Wet prep, genital     Status: Abnormal   Collection Time: 08/04/18  1:10 AM  Result Value Ref Range   Yeast Wet Prep HPF POC NONE SEEN NONE SEEN   Trich, Wet Prep NONE SEEN NONE SEEN   Clue Cells Wet Prep HPF POC NONE SEEN NONE SEEN   WBC, Wet Prep HPF POC FEW (A) NONE SEEN   Sperm NONE SEEN   Protein / creatinine ratio, urine     Status: None   Collection Time: 08/04/18  1:10 AM  Result Value Ref Range   Creatinine, Urine 257.21 mg/dL   Total Protein, Urine 29 mg/dL   Protein Creatinine Ratio 0.11 0.00 - 0.15 mg/mg[Cre]  CBC     Status: Abnormal   Collection Time: 08/04/18  1:37 AM  Result Value Ref Range   WBC 7.0 4.0 - 10.5 K/uL   RBC 3.80 (L) 3.87 - 5.11 MIL/uL   Hemoglobin 9.9 (L) 12.0 - 15.0 g/dL   HCT 54.2 (L) 70.6 - 23.7 %   MCV 80.0 80.0 - 100.0 fL   MCH 26.1 26.0 - 34.0 pg   MCHC 32.6 30.0 - 36.0 g/dL   RDW 62.8 31.5 - 17.6 %   Platelets 321 150 - 400 K/uL   nRBC 0.0 0.0 - 0.2 %  Comprehensive metabolic panel     Status: Abnormal   Collection Time: 08/04/18  1:37 AM  Result Value Ref Range   Sodium 134 (L) 135 - 145 mmol/L   Potassium 3.3 (L) 3.5 - 5.1 mmol/L   Chloride 106 98 - 111 mmol/L   CO2  19 (L) 22 - 32 mmol/L   Glucose, Bld 96 70 - 99 mg/dL   BUN 8 6 - 20 mg/dL   Creatinine, Ser 1.60 0.44 - 1.00 mg/dL   Calcium 8.9 8.9 - 73.7 mg/dL   Total Protein 6.2 (L) 6.5 - 8.1 g/dL   Albumin 3.1 (L) 3.5 - 5.0 g/dL   AST 15 15 - 41 U/L   ALT 13 0 - 44 U/L   Alkaline Phosphatase 66 38 - 126 U/L   Total Bilirubin 0.4 0.3 - 1.2 mg/dL   GFR calc non Af Amer >60 >60 mL/min   GFR calc Af Amer >60 >60 mL/min   Anion gap 9 5 - 15   No results found.  MAU Course  Procedures  MDM -r/o PTL       -CE: 1/long/posterior       -fFN: negative       -WetPrep: few WBCs, all else WNL       -GC/CT collected       -repeat CE unchanged -preeclampsia work-up       -of note, pt highest BP in MAU was 140/80, lowest 114/76       -UA: hazy/SG 1.034/30 PRO/rare bacteria, sending urine for culture based on pt symptoms       -PCr: 0.11       -CBC: H/H 9.9/30.4, will start on oral iron, all else WNL, pt reports she is currently on oral iron       -CMP: no abnormalities requiring treatment at this time       -Fioricet given for HA, pt reports HA after administration is 1/10, pt  also reports medication          helped to ease -EFM: reactive with few variables       -baseline: 135       -variability: moderate       -accels: present, 15x15       -decels: few variable       -TOCO: no ctx -pt discharged to home in stable condition  Orders Placed This Encounter  Procedures  . Wet prep, genital    Standing Status:   Standing    Number of Occurrences:   1  . Culture, OB Urine    Standing Status:   Standing    Number of Occurrences:   1  . Urinalysis, Routine w reflex microscopic    Standing Status:   Standing    Number of Occurrences:   1  . Fetal fibronectin    Standing Status:   Standing    Number of Occurrences:   1  . CBC    Standing Status:   Standing    Number of Occurrences:   1  . Comprehensive metabolic panel    Standing Status:   Standing    Number of Occurrences:   1  . Protein  / creatinine ratio, urine    Standing Status:   Standing    Number of Occurrences:   1  . Discharge patient    Order Specific Question:   Discharge disposition    Answer:   01-Home or Self Care [1]    Order Specific Question:   Discharge patient date    Answer:   08/04/2018   Meds ordered this encounter  Medications  . butalbital-acetaminophen-caffeine (FIORICET) 50-325-40 MG per tablet 1 tablet   Assessment and Plan   1. Acute nonintractable headache, unspecified headache type   2. Chronic midline low back pain without sciatica   3. Cramping affecting pregnancy, antepartum   4. NST (non-stress test) reactive   5. Anemia affecting pregnancy in third trimester    Allergies as of 08/04/2018      Reactions   Iodides Itching, Swelling   Penicillins Hives   Shellfish Allergy Swelling   Hydrocortisone Rash      Medication List    STOP taking these medications   meloxicam 15 MG tablet Commonly known as:  Mobic     TAKE these medications   albuterol 108 (90 Base) MCG/ACT inhaler Commonly known as:  VENTOLIN HFA Inhale 1-2 puffs into the lungs every 6 (six) hours as needed for wheezing or shortness of breath.   amphetamine-dextroamphetamine 10 MG tablet Commonly known as:  ADDERALL Take 30 mg by mouth daily with breakfast.       -discussed results and meaning of fFN -strict PTL/preeclampsia/return MAU precautions discussed -will call with culture results, if positive -discussed current hydration status and appropriate hydration/urination in pregnancy -discussed relationship of hydration and HA -discussed foods to take with oral iron and foods to avoid -pt advised to call OB for sooner appt to discuss worsening LBP -continue to take Flexeril as prescribed by your OB for back pain -discussed use of pregnancy belt for back pain -discussed pharmacologic and non-pharmacologic options for treating HA in pregnancy, pt encouraged to f/u with OB provider about HA -pt discharged  to home in stable condition  Joni Reining E Mrk Buzby 08/04/2018, 3:47 AM

## 2018-08-05 LAB — CULTURE, OB URINE: Culture: >=100000 — AB

## 2018-08-06 LAB — GC/CHLAMYDIA PROBE AMP (~~LOC~~) NOT AT ARMC
Chlamydia: NEGATIVE
Neisseria Gonorrhea: NEGATIVE

## 2018-12-31 ENCOUNTER — Encounter (HOSPITAL_COMMUNITY): Payer: Self-pay

## 2021-04-19 ENCOUNTER — Ambulatory Visit: Payer: Medicaid Other | Admitting: Family Medicine
